# Patient Record
Sex: Male | Born: 1950 | Race: White | Hispanic: No | Marital: Married | Smoking: Never smoker
Health system: Southern US, Community
[De-identification: ages and names within clinical notes are randomized; demographics above are authoritative.]

## PROBLEM LIST (undated history)

## (undated) DIAGNOSIS — E039 Hypothyroidism, unspecified: Secondary | ICD-10-CM

## (undated) DIAGNOSIS — I10 Essential (primary) hypertension: Secondary | ICD-10-CM

## (undated) DIAGNOSIS — M1990 Unspecified osteoarthritis, unspecified site: Secondary | ICD-10-CM

## (undated) HISTORY — PX: OTHER SURGICAL HISTORY: SHX169

---

## 2012-11-09 ENCOUNTER — Encounter (HOSPITAL_COMMUNITY): Payer: Self-pay | Admitting: Emergency Medicine

## 2012-11-09 ENCOUNTER — Emergency Department (HOSPITAL_COMMUNITY): Payer: Worker's Compensation

## 2012-11-09 ENCOUNTER — Emergency Department (HOSPITAL_COMMUNITY)
Admission: EM | Admit: 2012-11-09 | Discharge: 2012-11-09 | Disposition: A | Discharge status: Home / Self Care | Payer: Worker's Compensation | Attending: Emergency Medicine | Admitting: Emergency Medicine

## 2012-11-09 DIAGNOSIS — IMO0002 Reserved for concepts with insufficient information to code with codable children: Secondary | ICD-10-CM | POA: Insufficient documentation

## 2012-11-09 DIAGNOSIS — R55 Syncope and collapse: Secondary | ICD-10-CM | POA: Insufficient documentation

## 2012-11-09 DIAGNOSIS — I10 Essential (primary) hypertension: Secondary | ICD-10-CM | POA: Insufficient documentation

## 2012-11-09 DIAGNOSIS — R404 Transient alteration of awareness: Secondary | ICD-10-CM | POA: Insufficient documentation

## 2012-11-09 DIAGNOSIS — Z8739 Personal history of other diseases of the musculoskeletal system and connective tissue: Secondary | ICD-10-CM | POA: Insufficient documentation

## 2012-11-09 DIAGNOSIS — E039 Hypothyroidism, unspecified: Secondary | ICD-10-CM | POA: Insufficient documentation

## 2012-11-09 DIAGNOSIS — Y990 Civilian activity done for income or pay: Secondary | ICD-10-CM | POA: Insufficient documentation

## 2012-11-09 DIAGNOSIS — S76111A Strain of right quadriceps muscle, fascia and tendon, initial encounter: Secondary | ICD-10-CM

## 2012-11-09 DIAGNOSIS — Y9269 Other specified industrial and construction area as the place of occurrence of the external cause: Secondary | ICD-10-CM | POA: Insufficient documentation

## 2012-11-09 DIAGNOSIS — Y9389 Activity, other specified: Secondary | ICD-10-CM | POA: Insufficient documentation

## 2012-11-09 DIAGNOSIS — W1789XA Other fall from one level to another, initial encounter: Secondary | ICD-10-CM | POA: Insufficient documentation

## 2012-11-09 HISTORY — DX: Hypothyroidism, unspecified: E03.9

## 2012-11-09 HISTORY — DX: Essential (primary) hypertension: I10

## 2012-11-09 HISTORY — DX: Unspecified osteoarthritis, unspecified site: M19.90

## 2012-11-09 LAB — CBC WITH DIFFERENTIAL/PLATELET
Basophils Relative: 0 % (ref 0–1)
Eosinophils Absolute: 0 10*3/uL (ref 0.0–0.7)
Eosinophils Relative: 0 % (ref 0–5)
Lymphs Abs: 1.2 10*3/uL (ref 0.7–4.0)
MCH: 30.4 pg (ref 26.0–34.0)
MCHC: 35.9 g/dL (ref 30.0–36.0)
MCV: 84.8 fL (ref 78.0–100.0)
Monocytes Relative: 7 % (ref 3–12)
Neutrophils Relative %: 84 % — ABNORMAL HIGH (ref 43–77)
Platelets: 286 10*3/uL (ref 150–400)
RBC: 4.6 MIL/uL (ref 4.22–5.81)

## 2012-11-09 LAB — PROTIME-INR: INR: 0.95 (ref 0.00–1.49)

## 2012-11-09 LAB — COMPREHENSIVE METABOLIC PANEL
Albumin: 4.2 g/dL (ref 3.5–5.2)
BUN: 18 mg/dL (ref 6–23)
Calcium: 9.9 mg/dL (ref 8.4–10.5)
GFR calc Af Amer: >90 mL/min (ref >90)
Glucose, Bld: 111 mg/dL — ABNORMAL HIGH (ref 70–99)
Sodium: 139 mEq/L (ref 135–145)
Total Protein: 7.3 g/dL (ref 6.0–8.3)

## 2012-11-09 LAB — TROPONIN I: Troponin I: <0.3 ng/mL (ref <0.30)

## 2012-11-09 MED ORDER — HYDROCODONE-ACETAMINOPHEN 5-325 MG PO TABS: 1.0000 | ORAL_TABLET | Freq: Four times a day (QID) | ORAL | Status: AC | PRN

## 2012-11-09 MED ORDER — SODIUM CHLORIDE 0.9 % IV BOLUS (SEPSIS)
1000.0000 mL | Freq: Once | INTRAVENOUS | Status: AC
  Administered 2012-11-09: 1000 mL via INTRAVENOUS

## 2012-11-09 MED ORDER — HYDROMORPHONE HCL PF 1 MG/ML IJ SOLN
1.0000 mg | Freq: Once | INTRAMUSCULAR | Status: AC
  Administered 2012-11-09: 1 mg via INTRAVENOUS
  Filled 2012-11-09: qty 1

## 2012-11-09 NOTE — ED Provider Notes (Addendum)
History     CSN: [id number]  Arrival date & time 11/09/12  1326   First MD Initiated Contact with Patient 11/09/12 1331      Chief Complaint  Patient presents with  . Fall    (Consider location/radiation/quality/duration/timing/severity/associated sxs/prior treatment) HPI  Patient presents after a fall, with loss of consciousness.  The patient recalls the entirety of the events prior to loss of consciousness.  He states that he was stepping from a tall platform, and he felt acute onset of knee pain on the right.  Following the onset of pain, the patient sat, and soon thereafter with increasing pain he felt.  Lightheaded, subsequently lost consciousness.  There is no fall, no additional trauma. Upon awakening he had no headache, no chest pain, no lightheadedness, no dyspnea. He does however have persistent pain in the right anterior knee.  He has not been ambulatory since the event. Pain is anterior, severe, sharp, worse with motion and no attempts at relief with anything thus far.   Past Medical History  Diagnosis Date  . Hypertension   . Hypothyroidism   . Arthritis     Past Surgical History  Procedure Laterality Date  . Left arm surgery       No family history on file.  History  Substance Use Topics  . Smoking status: Never Smoker   . Smokeless tobacco: Not on file  . Alcohol Use: No      Review of Systems  All other systems reviewed and are negative.    Allergies  Penicillins  Home Medications  No current outpatient prescriptions on file.  SpO2 96%  Physical Exam  Nursing note and vitals reviewed. Constitutional: He is oriented to person, place, and time. He appears well-developed. No distress.  HENT:  Head: Normocephalic and atraumatic.  Eyes: Conjunctivae and EOM are normal.  Neck: Neck supple.  Patient cannot extend the knee, there is a palpable lesion on the anterior medial superior knee  Cardiovascular: Normal rate and regular rhythm.    Pulmonary/Chest: Effort normal. No stridor. No respiratory distress.  Abdominal: He exhibits no distension.  Musculoskeletal: He exhibits no edema.       Right hip: Normal.       Right knee: He exhibits decreased range of motion, swelling, deformity, abnormal patellar mobility and bony tenderness. Tenderness found. Patellar tendon tenderness noted.       Right ankle: Normal.  Neurological: He is alert and oriented to person, place, and time.  Skin: Skin is warm and dry.  Psychiatric: He has a normal mood and affect.    ED Course  ORTHOPEDIC INJURY TREATMENT Date/Time: 11/09/2012 3:47 PM Performed by: Gerhard Munch Authorized by: Gerhard Munch Consent: Verbal consent obtained. Risks and benefits: risks, benefits and alternatives were discussed Consent given by: patient Patient understanding: patient states understanding of the procedure being performed Patient consent: the patient's understanding of the procedure matches consent given Procedure consent: procedure consent matches procedure scheduled Relevant documents: relevant documents present and verified Test results: test results available and properly labeled Site marked: the operative site was marked Imaging studies: imaging studies available Required items: required blood products, implants, devices, and special equipment available Patient identity confirmed: verbally with patient Injury location: knee Location details: right knee Injury type: soft tissue Pre-procedure neurovascular assessment: neurovascularly intact Pre-procedure distal perfusion: normal Pre-procedure neurological function: normal Pre-procedure range of motion: reduced Local anesthesia used: no Patient sedated: no Immobilization: brace and crutches Splint type: long leg Supplies used: knee immobilizer. Post-procedure  neurovascular assessment: post-procedure neurovascularly intact Post-procedure distal perfusion: normal Post-procedure  neurological function: normal Post-procedure range of motion: unchanged Patient tolerance: Patient tolerated the procedure well with no immediate complications.   (including critical care time)  Labs Reviewed  CBC WITH DIFFERENTIAL  COMPREHENSIVE METABOLIC PANEL  TROPONIN I  PROTIME-INR   No results found.   029% room air normal Cardiac 70 sinus rhythm normal    Date: 11/09/2012  Rate: 65  Rhythm: normal sinus rhythm  QRS Axis: normal  Intervals: normal  ST/T Wave abnormalities: normal  Conduction Disutrbances: none  Narrative Interpretation: unremarkable        No diagnosis found.  Following initial evaluation, I evaluated the x-rays myself, it is abnormal.  I subsequently discussed it with our orthopedist on call. We agreed the patient will be seen tomorrow in the office after initiation of immobilization, provision of crutches here.    MDM  Patient presents after an episode of acute onset knee pain with subsequent syncopal events.  Notably, the patient had no headache or chest pain throughout, and his description of severe pain followed by lightheadedness and syncope is most consistent with vagal episode.  Patient has no history of dysrhythmia, nor coronary events, and his cardiac evaluation here is reassuring.  The patient's emergency room stay he was admitted in stable after placement of immobilizer, he was discharged in stable condition to followup with orthopedics in the morning.        Gerhard Munch, MD 11/09/12 1546  Gerhard Munch, MD 11/09/12 [phone]

## 2012-11-09 NOTE — ED Notes (Signed)
Phlebotomy consulted about blood collect. Informed they were en route.

## 2012-11-09 NOTE — ED Notes (Addendum)
Per EMS pt was at work and fell off of a boone lift about 4-5 ft high. Pt injured his right knee from the fall. Per EMS pt loss consciousness witnessed by coworkers for 3-5 minutes. EMS states GCS was 15 and the pt is A&O. Per EMS pt was given 100 mcg of fentanyl. Pt now rates his pain a 5 on a scale from 0-10. Per EMS pt has a hx of htn and hypothyroidism. Per EMS pt is allergic to penicillin.

## 2012-11-09 NOTE — ED Notes (Signed)
Patient transported to X-ray 

## 2012-11-09 NOTE — ED Notes (Signed)
Ortho paged. 

## 2012-11-09 NOTE — ED Notes (Signed)
Ortho at the bedside with pt and family.

## 2012-11-09 NOTE — Progress Notes (Signed)
Orthopedic Tech Progress Note Patient Details:  Melvin Golden [birth year] [id number]  Ortho Devices Type of Ortho Device: Knee Immobilizer;Crutches Ortho Device/Splint Location: RLE Ortho Device/Splint Interventions: Ordered;Application;Adjustment   Jennye Moccasin 11/09/2012, 4:13 PM

## 2014-09-30 IMAGING — CR DG KNEE COMPLETE 4+V*R*
4 series · 4 of 4 positions shown · non-contrast
Comparison: None.

CLINICAL DATA: Right knee pain.

RIGHT KNEE - COMPLETE 4+ VIEW

[t knee oblique right (1 of 2)]
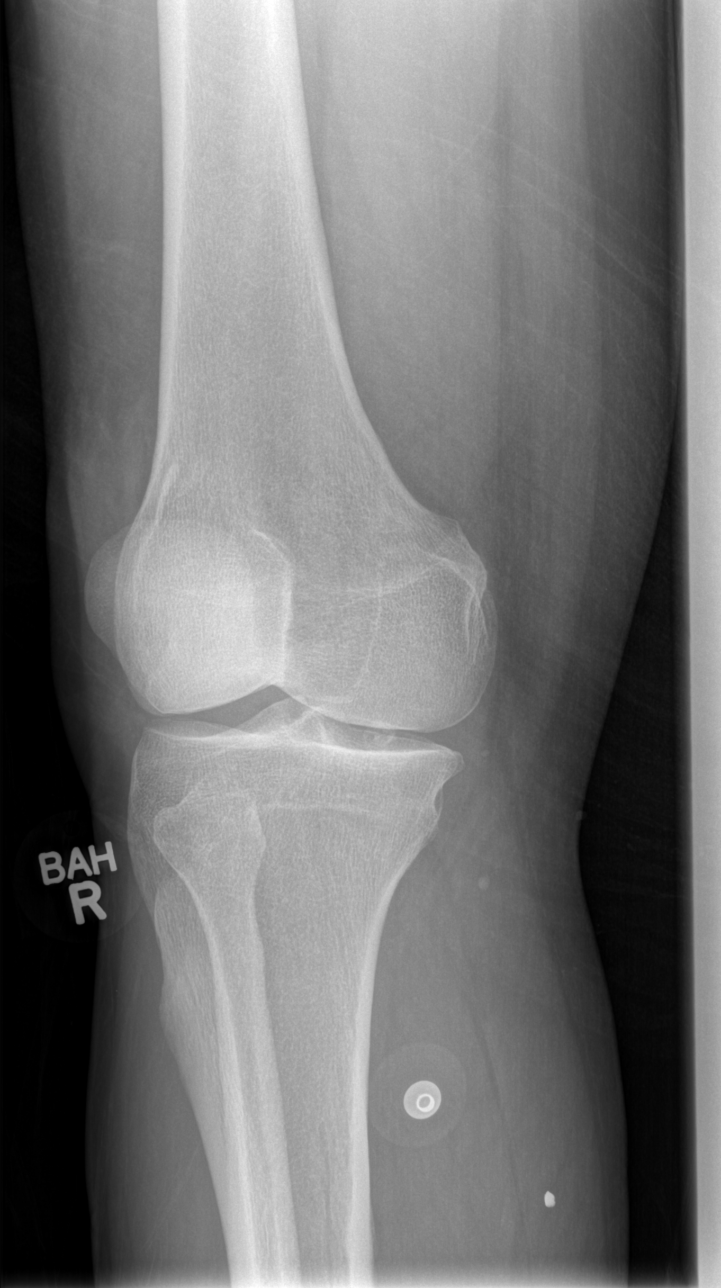

[t knee ap right]
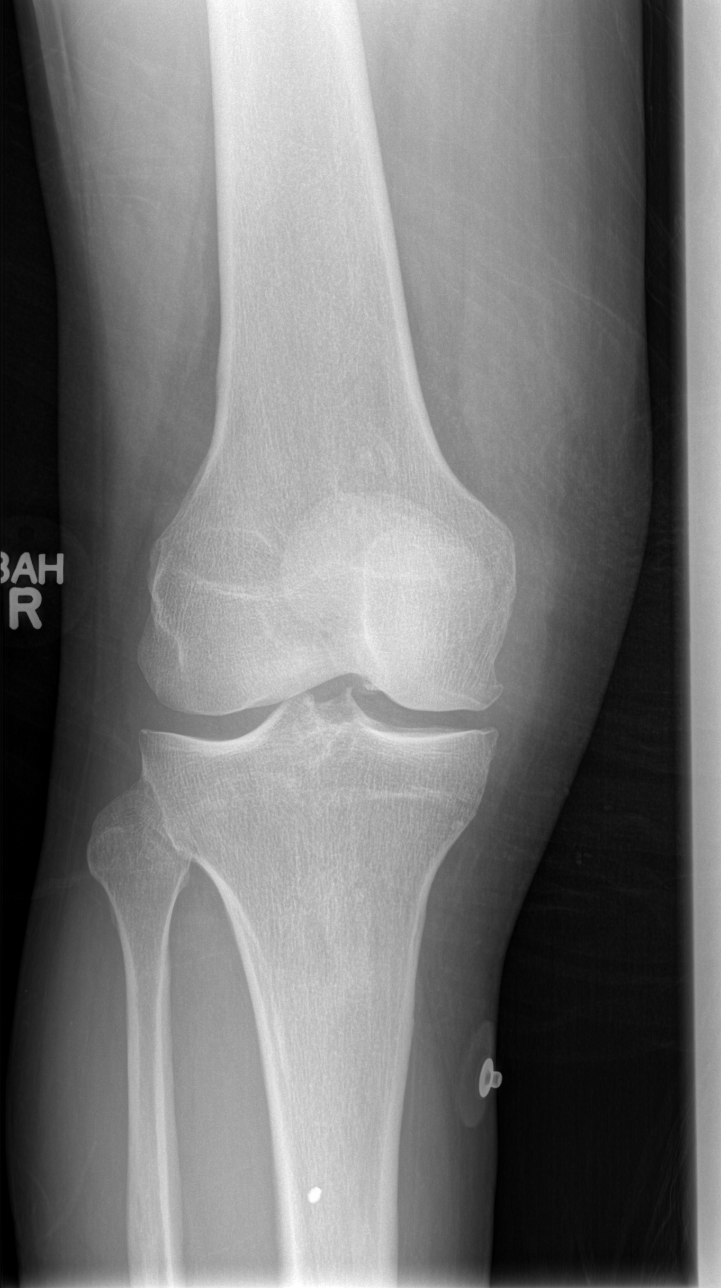

[t knee oblique right (2 of 2)]
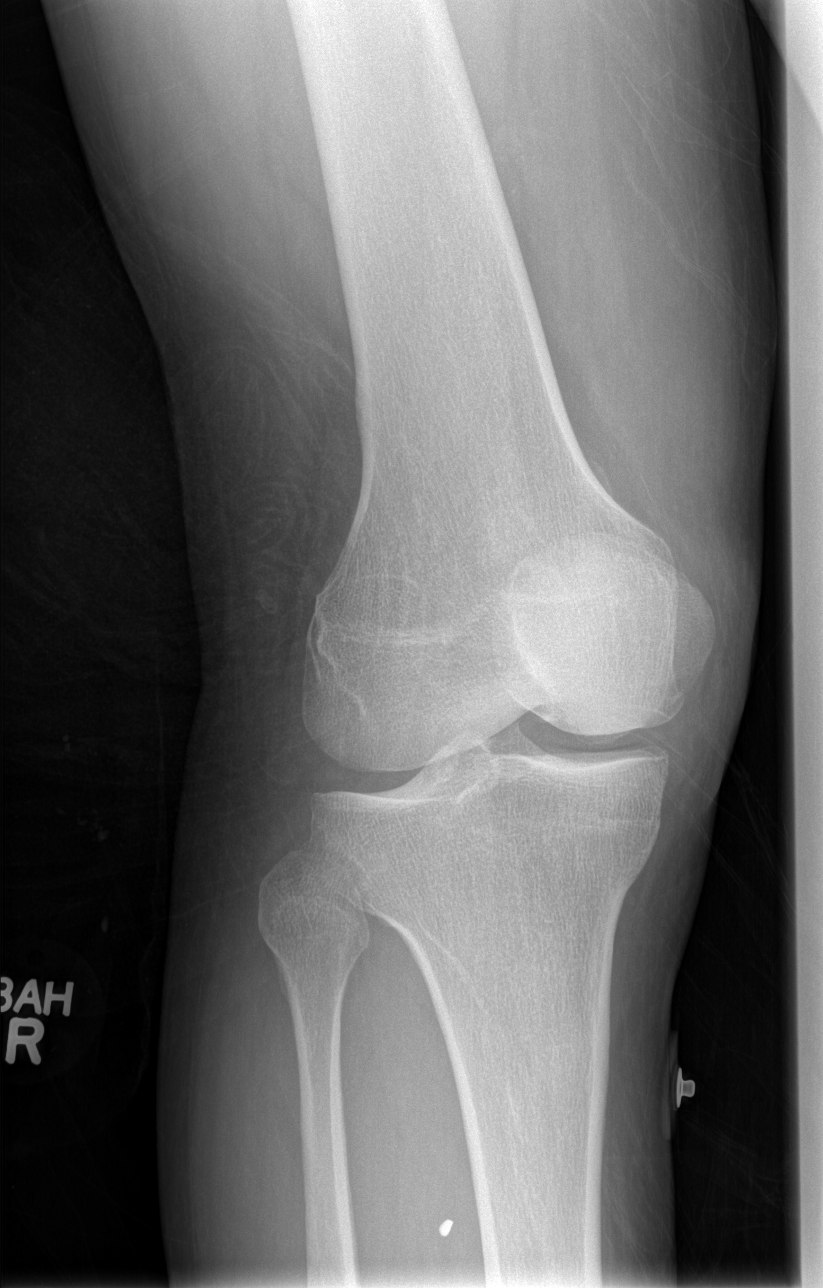

[t knee lat right]
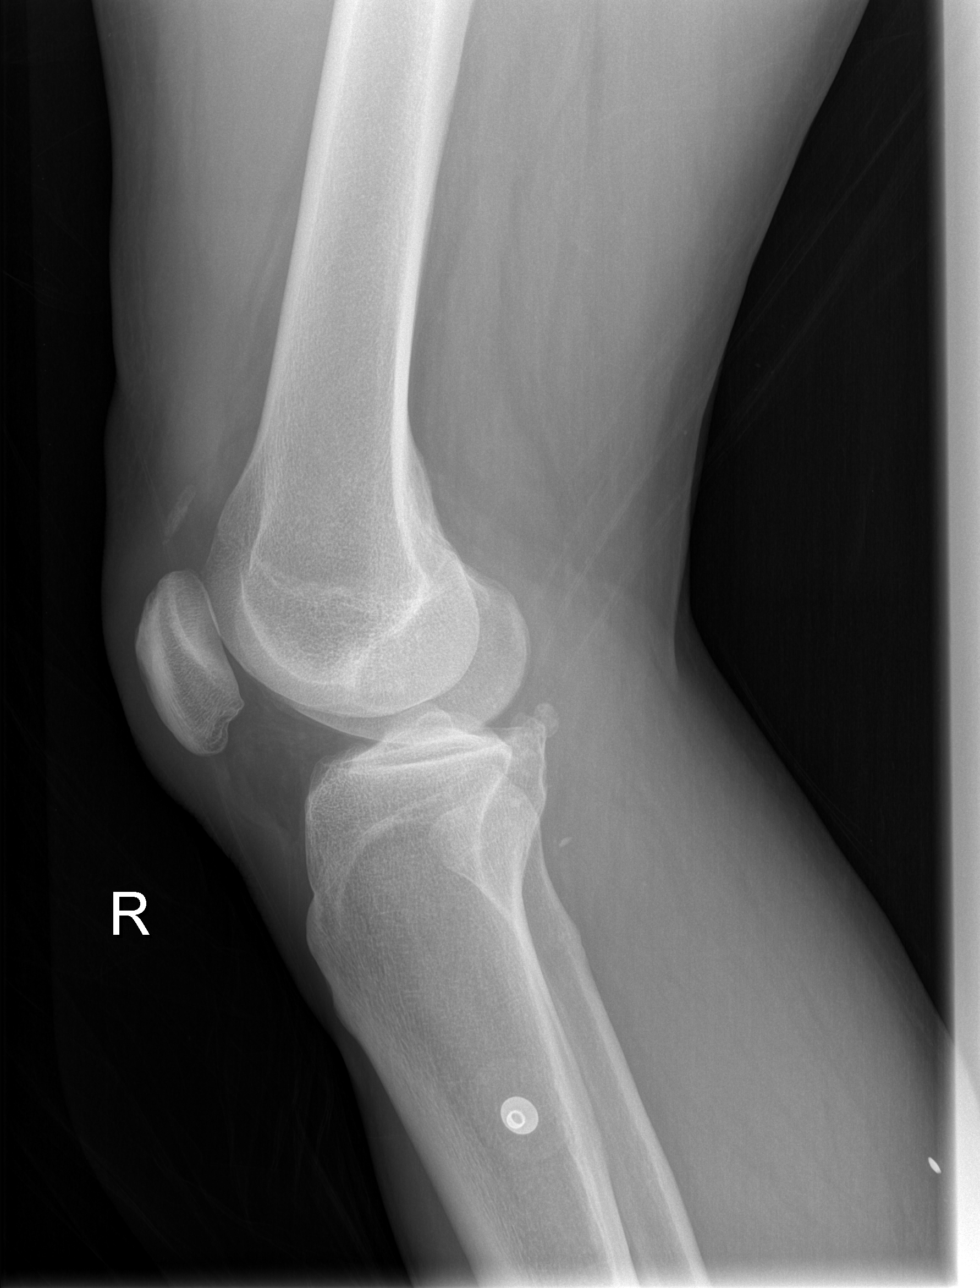

[4 of 4 positions shown; findings below may reference images not displayed]

FINDINGS: The knee is located and there is a small joint effusion.
The patella appears appropriately positioned.  There is a 1.5 cm
oval calcification superior to the patella.  This could be a loose
body or a calcification in the quadriceps tendon.  A loose body
within the small joint effusion is favored.  A quadriceps tendon
tear cannot be excluded by radiographs.
IMPRESSION: 1.  1.5 cm oval calcification superior to the patella could be a
loose body within the joint. The possibility of a quadriceps tendon
tear with an avulsion fragment from the patella cannot be excluded.
MRI could be considered for further evaluation if clinically
indicated.
2.  Small joint effusion.

## 2020-05-07 ENCOUNTER — Telehealth: Payer: Self-pay | Admitting: Emergency Medicine

## 2020-05-07 NOTE — Telephone Encounter (Signed)
I did not need this encounter. °
